# Patient Record
Sex: Male | Born: 1975 | Hispanic: Yes | Marital: Single | Smoking: Never smoker
Health system: Southern US, Community
[De-identification: ages and names within clinical notes are randomized; demographics above are authoritative.]

---

## 2019-02-11 ENCOUNTER — Inpatient Hospital Stay (HOSPITAL_COMMUNITY)
Admission: AD | Admit: 2019-02-11 | Discharge: 2019-02-16 | DRG: 177 | Disposition: A | Discharge status: Home / Self Care | Payer: HRSA Program | Source: Other Acute Inpatient Hospital | Attending: Internal Medicine | Admitting: Internal Medicine

## 2019-02-11 ENCOUNTER — Other Ambulatory Visit: Payer: Self-pay

## 2019-02-11 DIAGNOSIS — R7401 Elevation of levels of liver transaminase levels: Secondary | ICD-10-CM | POA: Diagnosis present

## 2019-02-11 DIAGNOSIS — R740 Nonspecific elevation of levels of transaminase and lactic acid dehydrogenase [LDH]: Secondary | ICD-10-CM | POA: Diagnosis present

## 2019-02-11 DIAGNOSIS — J1289 Other viral pneumonia: Secondary | ICD-10-CM | POA: Diagnosis present

## 2019-02-11 DIAGNOSIS — R0602 Shortness of breath: Secondary | ICD-10-CM

## 2019-02-11 DIAGNOSIS — Z9119 Patient's noncompliance with other medical treatment and regimen: Secondary | ICD-10-CM

## 2019-02-11 DIAGNOSIS — U071 COVID-19: Principal | ICD-10-CM

## 2019-02-11 DIAGNOSIS — E876 Hypokalemia: Secondary | ICD-10-CM | POA: Diagnosis present

## 2019-02-11 DIAGNOSIS — J9601 Acute respiratory failure with hypoxia: Secondary | ICD-10-CM

## 2019-02-11 DIAGNOSIS — E8770 Fluid overload, unspecified: Secondary | ICD-10-CM | POA: Diagnosis present

## 2019-02-11 DIAGNOSIS — J069 Acute upper respiratory infection, unspecified: Secondary | ICD-10-CM | POA: Diagnosis not present

## 2019-02-11 MED ORDER — ENOXAPARIN SODIUM 150 MG/ML ~~LOC~~ SOLN: 1.0000 mg/kg | Freq: Two times a day (BID) | SUBCUTANEOUS | Status: DC

## 2019-02-11 MED ORDER — LACTATED RINGERS IV SOLN
INTRAVENOUS | Status: DC
  Administered 2019-02-11 – 2019-02-14 (×4): via INTRAVENOUS

## 2019-02-11 MED ORDER — ENOXAPARIN SODIUM 80 MG/0.8ML ~~LOC~~ SOLN
1.0000 mg/kg | Freq: Two times a day (BID) | SUBCUTANEOUS | Status: DC
  Administered 2019-02-12: 75 mg via SUBCUTANEOUS
  Filled 2019-02-11: qty 0.8

## 2019-02-11 MED ORDER — SODIUM CHLORIDE 0.9 % IV SOLN: 1.0000 g | INTRAVENOUS | Status: DC

## 2019-02-11 MED ORDER — SODIUM CHLORIDE 0.9 % IV SOLN: 500.0000 mg | INTRAVENOUS | Status: DC

## 2019-02-11 MED ORDER — ENOXAPARIN SODIUM 30 MG/0.3ML ~~LOC~~ SOLN
30.0000 mg | Freq: Once | SUBCUTANEOUS | Status: AC
  Administered 2019-02-11: 23:00:00 30 mg via SUBCUTANEOUS
  Filled 2019-02-11: qty 0.3

## 2019-02-11 MED ORDER — POLYETHYLENE GLYCOL 3350 17 G PO PACK: 17.0000 g | PACK | Freq: Every day | ORAL | Status: DC | PRN

## 2019-02-11 MED ORDER — ONDANSETRON HCL 4 MG/2ML IJ SOLN: 4.0000 mg | Freq: Four times a day (QID) | INTRAMUSCULAR | Status: DC | PRN

## 2019-02-11 MED ORDER — METHYLPREDNISOLONE SODIUM SUCC 125 MG IJ SOLR
60.0000 mg | Freq: Four times a day (QID) | INTRAMUSCULAR | Status: DC
  Administered 2019-02-11 – 2019-02-12 (×3): 60 mg via INTRAVENOUS
  Filled 2019-02-11 (×3): qty 2

## 2019-02-11 MED ORDER — ONDANSETRON HCL 4 MG PO TABS: 4.0000 mg | ORAL_TABLET | Freq: Four times a day (QID) | ORAL | Status: DC | PRN

## 2019-02-11 MED ORDER — ACETAMINOPHEN 325 MG PO TABS
650.0000 mg | ORAL_TABLET | Freq: Four times a day (QID) | ORAL | Status: DC | PRN
  Administered 2019-02-12: 11:00:00 650 mg via ORAL
  Filled 2019-02-11: qty 2

## 2019-02-12 ENCOUNTER — Inpatient Hospital Stay (HOSPITAL_COMMUNITY): Payer: HRSA Program

## 2019-02-12 ENCOUNTER — Encounter (HOSPITAL_COMMUNITY): Payer: Self-pay

## 2019-02-12 LAB — COMPREHENSIVE METABOLIC PANEL
ALT: 84 U/L — ABNORMAL HIGH (ref 0–44)
AST: 211 U/L — ABNORMAL HIGH (ref 15–41)
Albumin: 2.6 g/dL — ABNORMAL LOW (ref 3.5–5.0)
Alkaline Phosphatase: 121 U/L (ref 38–126)
Anion gap: 12 (ref 5–15)
BUN: 15 mg/dL (ref 6–20)
CO2: 29 mmol/L (ref 22–32)
Calcium: 7.9 mg/dL — ABNORMAL LOW (ref 8.9–10.3)
Chloride: 94 mmol/L — ABNORMAL LOW (ref 98–111)
Creatinine, Ser: 0.73 mg/dL (ref 0.61–1.24)
GFR calc Af Amer: >60 mL/min (ref >60)
GFR calc non Af Amer: >60 mL/min (ref >60)
Glucose, Bld: 156 mg/dL — ABNORMAL HIGH (ref 70–99)
Potassium: 3.1 mmol/L — ABNORMAL LOW (ref 3.5–5.1)
Sodium: 135 mmol/L (ref 135–145)
Total Bilirubin: 0.8 mg/dL (ref 0.3–1.2)
Total Protein: 6.5 g/dL (ref 6.5–8.1)

## 2019-02-12 LAB — CBC WITH DIFFERENTIAL/PLATELET
Abs Immature Granulocytes: 0.04 10*3/uL (ref 0.00–0.07)
Basophils Absolute: 0 10*3/uL (ref 0.0–0.1)
Basophils Relative: 0 %
Eosinophils Absolute: 0 10*3/uL (ref 0.0–0.5)
Eosinophils Relative: 0 %
HCT: 44.9 % (ref 39.0–52.0)
Hemoglobin: 15.7 g/dL (ref 13.0–17.0)
Immature Granulocytes: 1 %
Lymphocytes Relative: 19 %
Lymphs Abs: 1.2 10*3/uL (ref 0.7–4.0)
MCH: 34.2 pg — ABNORMAL HIGH (ref 26.0–34.0)
MCHC: 35 g/dL (ref 30.0–36.0)
MCV: 97.8 fL (ref 80.0–100.0)
Monocytes Absolute: 0.3 10*3/uL (ref 0.1–1.0)
Monocytes Relative: 5 %
Neutro Abs: 4.5 10*3/uL (ref 1.7–7.7)
Neutrophils Relative %: 75 %
Platelets: 148 10*3/uL — ABNORMAL LOW (ref 150–400)
RBC: 4.59 MIL/uL (ref 4.22–5.81)
RDW: 13.6 % (ref 11.5–15.5)
WBC: 6 10*3/uL (ref 4.0–10.5)
nRBC: 0 % (ref 0.0–0.2)

## 2019-02-12 LAB — FERRITIN: Ferritin: 1031 ng/mL — ABNORMAL HIGH (ref 24–336)

## 2019-02-12 LAB — CK: Total CK: 93 U/L (ref 49–397)

## 2019-02-12 LAB — D-DIMER, QUANTITATIVE: D-Dimer, Quant: 0.38 ug/mL-FEU (ref 0.00–0.50)

## 2019-02-12 LAB — PHOSPHORUS: Phosphorus: 3.3 mg/dL (ref 2.5–4.6)

## 2019-02-12 LAB — PROCALCITONIN: Procalcitonin: 2.75 ng/mL

## 2019-02-12 LAB — ABO/RH: ABO/RH(D): A POS

## 2019-02-12 LAB — C-REACTIVE PROTEIN: CRP: 9.7 mg/dL — ABNORMAL HIGH (ref <1.0)

## 2019-02-12 LAB — TRIGLYCERIDES: Triglycerides: 173 mg/dL — ABNORMAL HIGH (ref <150)

## 2019-02-12 LAB — MAGNESIUM: Magnesium: 2.5 mg/dL — ABNORMAL HIGH (ref 1.7–2.4)

## 2019-02-12 MED ORDER — GUAIFENESIN-CODEINE 100-10 MG/5ML PO SOLN
10.0000 mL | Freq: Once | ORAL | Status: AC
  Administered 2019-02-12: 10 mL via ORAL
  Filled 2019-02-12: qty 10

## 2019-02-12 MED ORDER — TOCILIZUMAB 400 MG/20ML IV SOLN: 8.0000 mg/kg | Freq: Once | INTRAVENOUS | Status: DC

## 2019-02-12 MED ORDER — TOCILIZUMAB 400 MG/20ML IV SOLN
8.0000 mg/kg | Freq: Once | INTRAVENOUS | Status: AC
  Administered 2019-02-12: 14:00:00 598 mg via INTRAVENOUS
  Filled 2019-02-12: qty 20

## 2019-02-12 MED ORDER — METHYLPREDNISOLONE SODIUM SUCC 125 MG IJ SOLR
60.0000 mg | Freq: Two times a day (BID) | INTRAMUSCULAR | Status: DC
  Administered 2019-02-13 – 2019-02-16 (×8): 60 mg via INTRAVENOUS
  Filled 2019-02-12 (×8): qty 2

## 2019-02-12 MED ORDER — POTASSIUM CHLORIDE CRYS ER 20 MEQ PO TBCR
40.0000 meq | EXTENDED_RELEASE_TABLET | Freq: Four times a day (QID) | ORAL | Status: AC
  Administered 2019-02-12 (×2): 40 meq via ORAL
  Filled 2019-02-12 (×2): qty 2

## 2019-02-12 MED ORDER — POLYVINYL ALCOHOL 1.4 % OP SOLN
1.0000 [drp] | OPHTHALMIC | Status: DC | PRN
  Filled 2019-02-12 (×2): qty 15

## 2019-02-12 MED ORDER — ENOXAPARIN SODIUM 40 MG/0.4ML ~~LOC~~ SOLN
40.0000 mg | Freq: Two times a day (BID) | SUBCUTANEOUS | Status: DC
  Administered 2019-02-12 – 2019-02-16 (×8): 40 mg via SUBCUTANEOUS
  Filled 2019-02-12 (×8): qty 0.4

## 2019-02-12 NOTE — Progress Notes (Signed)
CardioV[facility] Research Department and AHF Team  ReDS Research Project   Patient #: [id number]  ReDS Measurement  Right: low quality x 3  Left:   

## 2019-02-12 NOTE — H&P (Signed)
History and Physical  [name] AVW:[id number] DOB: 03-Feb-1976 DOA: 02/11/2019  Referring physician: Corbin Ade, ER PA PCP: Patient does not have a PCP Outpatient Specialists: None Patient coming from: Home & is able to ambulate without assistance  Chief Complaint: Shortness of breath, cough and fever  HPI: Charles Harvey is a 43 y.o. male with no past medical history or previous hospitalizations who was in his usual state of health when approximately 1 week ago, started experiencing a multitude of symptoms including diarrhea, fever, nonproductive cough and shortness of breath.  With persistence of symptoms, he came into the emergency room at [facility] on the night of 5/14  ED Course: In the emergency room, patient had a chest x-ray which noted bilateral hazy opacities concerning for COVID-19.  Coronavirus test positive.  Patient had the rest of his blood work done which noted elevated d-dimer, LDH CRP and ferritin.  Noted to be hypoxemic on room air.  Patient placed on 2 L nasal cannula which improved his oxygenation to 93%.  Patient also noted to have transaminitis with AST of 374 and ALT of 117.  Hospitalist were called and patient was transported to [facility] for further care management  Review of Systems: Patient seen after arrival to [facility]. Pt complains of some mild pain located below midsternal most notably when he takes a deep inspiration.  Says his breathing is better now that he is on oxygen.  Still a very mild cough.  No abdominal pain, but he had some earlier when he had diarrhea.  Pt denies any headaches, vision changes, dysphagia, palpitations, current shortness of breath, wheezing, hematuria, dysuria, constipation, focal extremity numbness weakness or pain, nausea or vomiting.  Review of systems are otherwise negative  Past medical history: Patient denies any medical problems in the past.  Denies any previous surgeries or  hospitalizations.  Social History: Denies any tobacco, alcohol or drug use.  Lives at home with family.  Able to ambulate without assistance  Allergies: No known drug allergies.  Family history: Patient denies any medical problems that run in his family  Prior to Admission medications   Patient states he does not take any medications for anything    Physical Exam: BP 116/70   Pulse 71   Temp 98.4 F (36.9 C)   Resp 17   Ht 6' (1.829 m)   Wt 74.8 kg   SpO2 94%   BMI 22.38 kg/m   General: Alert and oriented x3, no acute distress Eyes: Sclera nonicteric, extraocular movements are intact ENT: Normocephalic and atraumatic, mucous membranes are moist Neck: Supple, no JVD Cardiovascular: Regular rate and rhythm, S1-S2 Respiratory: Limited inspiratory effort, but otherwise clear Abdomen: Soft, nontender, nondistended, positive bowel sounds Skin: Skin breaks, tears or lesions Musculoskeletal: No clubbing or cyanosis or edema Psychiatric: Appropriate, no evidence of psychoses Neurologic: No focal deficits          Labs on Admission:  Basic Metabolic Panel: No results for input(s): NA, K, CL, CO2, GLUCOSE, BUN, CREATININE, CALCIUM, MG, PHOS in the last 168 hours. Liver Function Tests: No results for input(s): AST, ALT, ALKPHOS, BILITOT, PROT, ALBUMIN in the last 168 hours. No results for input(s): LIPASE, AMYLASE in the last 168 hours. No results for input(s): AMMONIA in the last 168 hours. CBC: No results for input(s): WBC, NEUTROABS, HGB, HCT, MCV, PLT in the last 168 hours. Cardiac Enzymes: No results for input(s): CKTOTAL, CKMB, CKMBINDEX, TROPONINI in the last 168 hours.  BNP (last 3 results) No results for input(s): BNP in the last 8760 hours.  ProBNP (last 3 results) No results for input(s): PROBNP in the last 8760 hours.  CBG: No results for input(s): GLUCAP in the last 168 hours.  Radiological Exams on Admission: [facility].IMPRESSION: Diffuse hazy  bilateral airspace opacities concerning for an atypical infectious process such as viral pneumonia. Pulmonary edema can have a similar appearance in the appropriate clinical setting.  EKG: Not done  Assessment/Plan Present on Admission: . Acute respiratory failure with hypoxia (HCC) secondary to acute respiratory disease from COVID-19 virus: Symptomatically treat.  On IV steroids plus supplemental oxygen.  Given transaminitis, holding on Actemra.  Hopefully he will make a good recovery.  . Transaminitis: Unclear etiology.  No previous history of any liver disease.  Have ordered acute hepatitis panel and follow-up liver function test in the morning  Principal Problem:   Acute respiratory failure with hypoxia (HCC) Active Problems:   Acute respiratory disease due to COVID-19 virus   Transaminitis   DVT prophylaxis: Lovenox adjusted for COVID-19   Code Status: Full code  Family Communication: We will update family in the morning  Disposition Plan: Home once able to be weaned off of oxygen  Consults called: None  Admission status: Given need for acute hospital services past 2 midnights, admit as inpatient    Hollice EspySendil K Kerry Odonohue MD [facility] Pager [phone]  If 7PM-7AM, please contact night-coverage [email or web address] Password [email or web address]  02/12/2019, 1:24 AM

## 2019-02-12 NOTE — Progress Notes (Signed)
PROGRESS NOTE                                                                                                                                                                                                             Patient Demographics:    Charles Harvey, is a 43 y.o. male, DOB - 06/22/1976, XLK:[id number]  Outpatient Primary MD for the patient is No primary care provider on file.    LOS - 1  CC Fever     Brief Narrative   Charles Harvey is a 43 y.o. male with no past medical history or previous hospitalizations who was in his usual state of health when approximately 1 week ago, started experiencing a multitude of symptoms including diarrhea, fever, nonproductive cough and shortness of breath.  With persistence of symptoms, he came into [facility] on the night of 02/11/19.   Subjective:    Charles Harvey today has, No headache, No chest pain, No abdominal pain - No Nausea, No new weakness tingling or numbness, mild  Cough - SOB.     Assessment  & Plan :     1. Acute Hypoxic Resp. Failure due to Acute Covid 19 Viral Illness during the ongoing 2020 Covid 19 Pandemic - with documented hypoxia at [facility], chest x-ray showing viral pneumonitis, elevated inflammatory markers, started on IV steroids along with Actemra, trend clinically.  Encouraged to sit up in chair in the daytime, flutter valve added for pulmonary toiletry, when in bed requested to prone.  COVID-19 Labs  Recent Labs    02/12/19 0620  DDIMER 0.38  FERRITIN 1,031*  CRP 9.7*    No results found for: SARSCOV2NAA   2.  Mild transaminitis- due to COVID-19 infection, may go up slightly after Actemra use.  Will trend and monitor.  No right upper quadrant pain.  3.  Hypokalemia.  Replaced.      Condition -   Guarded  Family Communication  :  None  Code Status :  Full  Diet : Regular  Disposition  Plan  :  Home 3-4 days  Consults  :  None  Procedures  :  None  PUD Prophylaxis : None  DVT Prophylaxis  :  Lovenox   Lab Results  Component Value Date   PLT 148 (L) 02/12/2019  Inpatient Medications  Scheduled Meds: . enoxaparin (LOVENOX) injection  1 mg/kg Subcutaneous Q12H  . methylPREDNISolone (SOLU-MEDROL) injection  60 mg Intravenous Q6H   Continuous Infusions: . lactated ringers 100 mL/hr at 02/12/19 0800  . tocilizumab (ACTEMRA) IV     PRN Meds:.acetaminophen, [DISCONTINUED] ondansetron **OR** ondansetron (ZOFRAN) IV, polyethylene glycol  Antibiotics  :    Anti-infectives (From admission, onward)   Start     Dose/Rate Route Frequency Ordered Stop   02/12/19 1800  azithromycin (ZITHROMAX) 500 mg in sodium chloride 0.9 % 250 mL IVPB  Status:  Discontinued     500 mg 250 mL/hr over 60 Minutes Intravenous Every 24 hours 02/11/19 2150 02/12/19 0748   02/12/19 1700  cefTRIAXone (ROCEPHIN) 1 g in sodium chloride 0.9 % 100 mL IVPB  Status:  Discontinued     1 g 200 mL/hr over 30 Minutes Intravenous Every 24 hours 02/11/19 2150 02/12/19 0748       Time Spent in minutes  30   Susa Raring M.D on 02/12/2019 at 11:00 AM  To page go to [email or web address] - password [email or web address]  [facility] -  Office  [phone]     Admit date - 02/11/2019    1    Objective:   Vitals:   02/12/19 0400 02/12/19 0500 02/12/19 0600 02/12/19 0900  BP:      Pulse: (!) 55  (!) 55   Resp: (!) 31  (!) 26   Temp:  98.7 F (37.1 C)  98.6 F (37 C)  TempSrc:      SpO2: 94%  96%   Weight:      Height:        Wt Readings from Last 3 Encounters:  02/11/19 74.8 kg     Intake/Output Summary (Last 24 hours) at 02/12/2019 1100 Last data filed at 02/12/2019 0800 Gross per 24 hour  Intake 899.43 ml  Output -  Net 899.43 ml     Physical Exam  Awake Alert, Oriented X 3, No new F.N deficits, Normal affect [place].AT,PERRAL Supple Neck,No JVD, No cervical lymphadenopathy appriciated.   Symmetrical Chest wall movement, Good air movement bilaterally, CTAB RRR,No Gallops,Rubs or new Murmurs, No Parasternal Heave +ve B.Sounds, Abd Soft, No tenderness, No organomegaly appriciated, No rebound - guarding or rigidity. No Cyanosis, Clubbing or edema, No new Rash or bruise       Data Review:    CBC Recent Labs  Lab 02/12/19 0620  WBC 6.0  HGB 15.7  HCT 44.9  PLT 148*  MCV 97.8  MCH 34.2*  MCHC 35.0  RDW 13.6  LYMPHSABS 1.2  MONOABS 0.3  EOSABS 0.0  BASOSABS 0.0    Chemistries  Recent Labs  Lab 02/12/19 0620  NA 135  K 3.1*  CL 94*  CO2 29  GLUCOSE 156*  BUN 15  CREATININE 0.73  CALCIUM 7.9*  MG 2.5*  AST 211*  ALT 84*  ALKPHOS 121  BILITOT 0.8   ------------------------------------------------------------------------------------------------------------------ Recent Labs    02/12/19 0620  TRIG 173*    No results found for: HGBA1C ------------------------------------------------------------------------------------------------------------------ No results for input(s): TSH, T4TOTAL, T3FREE, THYROIDAB in the last 72 hours.  Invalid input(s): FREET3  Cardiac Enzymes No results for input(s): CKMB, TROPONINI, MYOGLOBIN in the last 168 hours.  Invalid input(s): CK ------------------------------------------------------------------------------------------------------------------ No results found for: BNP  Micro Results No results found for this or any previous visit (from the past 240 hour(s)).  Radiology Reports Dg Chest Port 1 View  Result Date: 02/12/2019 CLINICAL DATA:  Shortness of breath. Positive test for COVID-19. EXAM: PORTABLE CHEST 1 VIEW COMPARISON:  02/11/2019 FINDINGS: The cardiac silhouette is upper limits of normal in size. The lungs are mildly hypoinflated with diffuse bilateral interstitial type opacities as well as asymmetric opacity in the left mid lung, overall similar to the prior study. No pleural effusion or pneumothorax  is identified. No acute osseous abnormality is seen. IMPRESSION: Unchanged ill-defined lung opacities compatible with known viral infection. Electronically Signed   By: Sebastian Ache M.D.   On: 02/12/2019 08:55

## 2019-02-12 NOTE — Progress Notes (Signed)
Patient refusing to get in prone position  as ordered by provider. RN explained reason  and importance via interpreter services, patient states he cannot and won't sleep in that position. On call MD Dr Joseph Art made aware. Patient sats currently at 94-96% on 3L. I will continue to monitor patient has shift progresses

## 2019-02-12 NOTE — Progress Notes (Signed)
Patient asking for cough suppressant. Covering Hospitalist   paged via amion and informed of request

## 2019-02-12 NOTE — TOC Initial Note (Signed)
Transition of Care [facility]) - Initial/Assessment Note    Patient Details  Name: Charles Harvey MRN: [id number] Date of Birth: [birth year]  [facility] CM/SW Contact:    Colleen Can RN, BSN, NCM-BC, ACM-RN [phone] Phone Number: 02/12/2019, 2:32 PM  Clinical Narrative:                 43 yo male presented after experiencing diarrhea, fever, nonproductive cough and shortness of breath; COVID-19 (+). Patient lives at home with no DME in use PTA. Patient has no active health insurance, nor an established PCP. CM arranged a televisit hospital f/u appointment with: [facility] on 02/18/19 @ 1350; AVS updated. CM team will continue to follow for dispositional needs.   Expected Discharge Plan: Home/Self Care Barriers to Discharge: Continued Medical Work up   Expected Discharge Plan and Services Expected Discharge Plan: Home/Self Care   Discharge Planning Services: CM Consult, Follow-up appt scheduled, [facility]  Prior Living Arrangements/Services     Patient language and need for interpreter reviewed:: Yes  Activities of Daily Living Home Assistive Devices/Equipment: None ADL Screening (condition at time of admission) Patient's cognitive ability adequate to safely complete daily activities?: Yes Is the patient deaf or have difficulty hearing?: No Does the patient have difficulty seeing, even when wearing glasses/contacts?: No Does the patient have difficulty concentrating, remembering, or making decisions?: No Patient able to express need for assistance with ADLs?: Yes Does the patient have difficulty dressing or bathing?: No Independently performs ADLs?: Yes (appropriate for developmental age) Does the patient have difficulty walking or climbing stairs?: No Weakness of Legs: None Weakness of Arms/Hands: None  Admission diagnosis:  Covid positive Patient Active Problem List   Diagnosis Date Noted  . Acute respiratory failure with hypoxia (HCC) 02/11/2019   . Acute respiratory disease due to COVID-19 virus 02/11/2019  . Transaminitis 02/11/2019   PCP:  System, Pcp Not In Pharmacy:  No Pharmacies Listed    Social Determinants of Health (SDOH) Interventions    Readmission Risk Interventions No flowsheet data found.

## 2019-02-13 LAB — CBC WITH DIFFERENTIAL/PLATELET
Abs Immature Granulocytes: 0.21 10*3/uL — ABNORMAL HIGH (ref 0.00–0.07)
Basophils Absolute: 0 10*3/uL (ref 0.0–0.1)
Basophils Relative: 0 %
Eosinophils Absolute: 0 10*3/uL (ref 0.0–0.5)
Eosinophils Relative: 0 %
HCT: 42.8 % (ref 39.0–52.0)
Hemoglobin: 14.3 g/dL (ref 13.0–17.0)
Immature Granulocytes: 2 %
Lymphocytes Relative: 8 %
Lymphs Abs: 1.1 10*3/uL (ref 0.7–4.0)
MCH: 32.9 pg (ref 26.0–34.0)
MCHC: 33.4 g/dL (ref 30.0–36.0)
MCV: 98.6 fL (ref 80.0–100.0)
Monocytes Absolute: 0.7 10*3/uL (ref 0.1–1.0)
Monocytes Relative: 5 %
Neutro Abs: 10.6 10*3/uL — ABNORMAL HIGH (ref 1.7–7.7)
Neutrophils Relative %: 85 %
Platelets: 178 10*3/uL (ref 150–400)
RBC: 4.34 MIL/uL (ref 4.22–5.81)
RDW: 13.6 % (ref 11.5–15.5)
WBC: 12.6 10*3/uL — ABNORMAL HIGH (ref 4.0–10.5)
nRBC: 0 % (ref 0.0–0.2)

## 2019-02-13 LAB — COMPREHENSIVE METABOLIC PANEL
ALT: 69 U/L — ABNORMAL HIGH (ref 0–44)
AST: 119 U/L — ABNORMAL HIGH (ref 15–41)
Albumin: 2.6 g/dL — ABNORMAL LOW (ref 3.5–5.0)
Alkaline Phosphatase: 126 U/L (ref 38–126)
Anion gap: 9 (ref 5–15)
BUN: 17 mg/dL (ref 6–20)
CO2: 27 mmol/L (ref 22–32)
Calcium: 8 mg/dL — ABNORMAL LOW (ref 8.9–10.3)
Chloride: 101 mmol/L (ref 98–111)
Creatinine, Ser: 0.65 mg/dL (ref 0.61–1.24)
GFR calc Af Amer: >60 mL/min (ref >60)
GFR calc non Af Amer: >60 mL/min (ref >60)
Glucose, Bld: 140 mg/dL — ABNORMAL HIGH (ref 70–99)
Potassium: 3.5 mmol/L (ref 3.5–5.1)
Sodium: 137 mmol/L (ref 135–145)
Total Bilirubin: 0.6 mg/dL (ref 0.3–1.2)
Total Protein: 6.2 g/dL — ABNORMAL LOW (ref 6.5–8.1)

## 2019-02-13 LAB — C-REACTIVE PROTEIN: CRP: 4.4 mg/dL — ABNORMAL HIGH (ref <1.0)

## 2019-02-13 LAB — MAGNESIUM: Magnesium: 2.4 mg/dL (ref 1.7–2.4)

## 2019-02-13 LAB — HIV ANTIBODY (ROUTINE TESTING W REFLEX): HIV Screen 4th Generation wRfx: NONREACTIVE

## 2019-02-13 LAB — PHOSPHORUS: Phosphorus: 2.4 mg/dL — ABNORMAL LOW (ref 2.5–4.6)

## 2019-02-13 LAB — HEPATITIS PANEL, ACUTE
HCV Ab: <0.1 s/co ratio (ref 0.0–0.9)
Hep A IgM: NEGATIVE
Hep B C IgM: NEGATIVE
Hepatitis B Surface Ag: NEGATIVE

## 2019-02-13 LAB — FERRITIN: Ferritin: 716 ng/mL — ABNORMAL HIGH (ref 24–336)

## 2019-02-13 LAB — TRIGLYCERIDES: Triglycerides: 233 mg/dL — ABNORMAL HIGH (ref <150)

## 2019-02-13 LAB — INTERLEUKIN-6, PLASMA: Interleukin-6, Plasma: 7.5 pg/mL (ref 0.0–12.2)

## 2019-02-13 LAB — CK: Total CK: 60 U/L (ref 49–397)

## 2019-02-13 MED ORDER — GUAIFENESIN-DM 100-10 MG/5ML PO SYRP
10.0000 mL | ORAL_SOLUTION | ORAL | Status: DC | PRN
  Administered 2019-02-13 (×2): 10 mL via ORAL
  Filled 2019-02-13 (×2): qty 10

## 2019-02-13 NOTE — Progress Notes (Signed)
PROGRESS NOTE                                                                                                                                                                                                             Patient Demographics:    Charles Harvey, is a 43 y.o. male, DOB - Nov 01, 1975, YDS:[id number]  Outpatient Primary MD for the patient is System, Pcp Not In    LOS - 2  CC Fever     Brief Narrative   Charles Harvey is a 43 y.o. male with no past medical history or previous hospitalizations who was in his usual state of health when approximately 1 week ago, started experiencing a multitude of symptoms including diarrhea, fever, nonproductive cough and shortness of breath.  With persistence of symptoms, he came into the emergency room at [facility] on the night of 02/11/19.   Subjective:   Patient in bed, appears comfortable, denies any headache, no fever, no chest pain or pressure, improved shortness of breath has mild cough , no abdominal pain. No focal weakness.    Assessment  & Plan :     1. Acute Hypoxic Resp. Failure due to Acute Covid 19 Viral Illness during the ongoing 2020 Covid 19 Pandemic - with documented hypoxia at [facility], chest x-ray showing viral pneumonitis, elevated inflammatory markers, he was started on IV steroids along with Actemra on 02/13/2019, inflammatory markers have started to trend down, shortness of breath has improved. Encouraged to sit up in chair in the daytime, flutter valve added for pulmonary toiletry, when in bed requested to prone.  Will monitor clinically if continues to improve discharge in 1 to 2 days on oral steroid taper.  COVID-19 Labs  Recent Labs    02/12/19 0620 02/13/19 0333  DDIMER 0.38  --   FERRITIN 1,031* 716*  CRP 9.7* 4.4*    No results found for: SARSCOV2NAA   2.  Mild transaminitis- due to COVID-19 infection, may go up slightly  after Actemra use.  Improving trend.  No right upper quadrant pain.  3.  Hypokalemia.  Replaced.      Condition -   Fair  Family Communication  :  None  Code Status :  Full  Diet : Regular  Disposition Plan  :  Home 1-2 days  Consults  :  None  Procedures  :  None  PUD Prophylaxis : None  DVT Prophylaxis  :  Lovenox   Lab Results  Component Value Date   PLT 178 02/13/2019    Inpatient Medications  Scheduled Meds: . enoxaparin (LOVENOX) injection  40 mg Subcutaneous Q12H  . methylPREDNISolone (SOLU-MEDROL) injection  60 mg Intravenous Q12H   Continuous Infusions: . lactated ringers 100 mL/hr at 02/12/19 1600   PRN Meds:.acetaminophen, [DISCONTINUED] ondansetron **OR** ondansetron (ZOFRAN) IV, polyethylene glycol, polyvinyl alcohol  Antibiotics  :    Anti-infectives (From admission, onward)   Start     Dose/Rate Route Frequency Ordered Stop   02/12/19 1800  azithromycin (ZITHROMAX) 500 mg in sodium chloride 0.9 % 250 mL IVPB  Status:  Discontinued     500 mg 250 mL/hr over 60 Minutes Intravenous Every 24 hours 02/11/19 2150 02/12/19 0748   02/12/19 1700  cefTRIAXone (ROCEPHIN) 1 g in sodium chloride 0.9 % 100 mL IVPB  Status:  Discontinued     1 g 200 mL/hr over 30 Minutes Intravenous Every 24 hours 02/11/19 2150 02/12/19 0748       Time Spent in minutes  30   Susa RaringPrashant Khoen Genet M.D on 02/13/2019 at 10:58 AM  To page go to [email or web address] - password [email or web address]  [facility] -  Office  [phone]     Admit date - 02/11/2019    2    Objective:   Vitals:   02/12/19 1300 02/12/19 2300 02/13/19 0439 02/13/19 1014  BP:  104/60 106/71 117/73  Pulse:  75 64 64  Resp:      Temp: 98.7 F (37.1 C) 98.7 F (37.1 C) 98.3 F (36.8 C)   TempSrc:  Oral Oral   SpO2:  91% 95% 93%  Weight:      Height:        Wt Readings from Last 3 Encounters:  02/11/19 74.8 kg     Intake/Output Summary (Last 24 hours) at 02/13/2019 1058 Last data filed at 02/13/2019  1000 Gross per 24 hour  Intake 2945.93 ml  Output -  Net 2945.93 ml     Physical Exam  Awake Alert, Oriented X 3, No new F.N deficits, Normal affect [place].AT,PERRAL Supple Neck,No JVD, No cervical lymphadenopathy appriciated.  Symmetrical Chest wall movement, Good air movement bilaterally, CTAB RRR,No Gallops, Rubs or new Murmurs, No Parasternal Heave +ve B.Sounds, Abd Soft, No tenderness, No organomegaly appriciated, No rebound - guarding or rigidity. No Cyanosis, Clubbing or edema, No new Rash or bruise    Data Review:    CBC Recent Labs  Lab 02/12/19 0620 02/13/19 0333  WBC 6.0 12.6*  HGB 15.7 14.3  HCT 44.9 42.8  PLT 148* 178  MCV 97.8 98.6  MCH 34.2* 32.9  MCHC 35.0 33.4  RDW 13.6 13.6  LYMPHSABS 1.2 1.1  MONOABS 0.3 0.7  EOSABS 0.0 0.0  BASOSABS 0.0 0.0    Chemistries  Recent Labs  Lab 02/12/19 0620 02/13/19 0333  NA 135 137  K 3.1* 3.5  CL 94* 101  CO2 29 27  GLUCOSE 156* 140*  BUN 15 17  CREATININE 0.73 0.65  CALCIUM 7.9* 8.0*  MG 2.5* 2.4  AST 211* 119*  ALT 84* 69*  ALKPHOS 121 126  BILITOT 0.8 0.6   ------------------------------------------------------------------------------------------------------------------ Recent Labs    02/12/19 0620 02/13/19 0333  TRIG 173* 233*    No results found for: HGBA1C ------------------------------------------------------------------------------------------------------------------ No results for input(s): TSH, T4TOTAL, T3FREE, THYROIDAB in the last 72 hours.  Invalid  input(s): FREET3  Cardiac Enzymes No results for input(s): CKMB, TROPONINI, MYOGLOBIN in the last 168 hours.  Invalid input(s): CK ------------------------------------------------------------------------------------------------------------------ No results found for: BNP  Micro Results No results found for this or any previous visit (from the past 240 hour(s)).  Radiology Reports Dg Chest Port 1 View  Result Date: 02/12/2019  CLINICAL DATA:  Shortness of breath. Positive test for COVID-19. EXAM: PORTABLE CHEST 1 VIEW COMPARISON:  02/11/2019 FINDINGS: The cardiac silhouette is upper limits of normal in size. The lungs are mildly hypoinflated with diffuse bilateral interstitial type opacities as well as asymmetric opacity in the left mid lung, overall similar to the prior study. No pleural effusion or pneumothorax is identified. No acute osseous abnormality is seen. IMPRESSION: Unchanged ill-defined lung opacities compatible with known viral infection. Electronically Signed   By: Sebastian Ache M.D.   On: 02/12/2019 08:55

## 2019-02-13 NOTE — Progress Notes (Signed)
Ask patient if we wished for me to call his wife, he admits being in contact with his wife about his wellbeing via personal cell

## 2019-02-13 NOTE — Progress Notes (Signed)
CardioV[facility] Research Department and AHF Team  ReDS Research Project   Patient #: [id number]  ReDS Measurement  Right: low quality x 3  Left:   

## 2019-02-13 NOTE — Progress Notes (Signed)
Via interpreter services patient stated he did not wish to have his wife called, as he has been talking with  Her  directly about his status

## 2019-02-13 NOTE — Progress Notes (Signed)
Cough syrup( Guaifenesin-codeine) given at this time to help with consistent cough

## 2019-02-14 ENCOUNTER — Inpatient Hospital Stay (HOSPITAL_COMMUNITY): Payer: HRSA Program

## 2019-02-14 LAB — COMPREHENSIVE METABOLIC PANEL
ALT: 84 U/L — ABNORMAL HIGH (ref 0–44)
AST: 163 U/L — ABNORMAL HIGH (ref 15–41)
Albumin: 2.6 g/dL — ABNORMAL LOW (ref 3.5–5.0)
Alkaline Phosphatase: 137 U/L — ABNORMAL HIGH (ref 38–126)
Anion gap: 14 (ref 5–15)
BUN: 16 mg/dL (ref 6–20)
CO2: 23 mmol/L (ref 22–32)
Calcium: 8.3 mg/dL — ABNORMAL LOW (ref 8.9–10.3)
Chloride: 104 mmol/L (ref 98–111)
Creatinine, Ser: 0.59 mg/dL — ABNORMAL LOW (ref 0.61–1.24)
GFR calc Af Amer: >60 mL/min (ref >60)
GFR calc non Af Amer: >60 mL/min (ref >60)
Glucose, Bld: 119 mg/dL — ABNORMAL HIGH (ref 70–99)
Potassium: 3.8 mmol/L (ref 3.5–5.1)
Sodium: 141 mmol/L (ref 135–145)
Total Bilirubin: 0.7 mg/dL (ref 0.3–1.2)
Total Protein: 6.1 g/dL — ABNORMAL LOW (ref 6.5–8.1)

## 2019-02-14 LAB — CBC WITH DIFFERENTIAL/PLATELET
Abs Immature Granulocytes: 0.16 10*3/uL — ABNORMAL HIGH (ref 0.00–0.07)
Basophils Absolute: 0 10*3/uL (ref 0.0–0.1)
Basophils Relative: 0 %
Eosinophils Absolute: 0 10*3/uL (ref 0.0–0.5)
Eosinophils Relative: 0 %
HCT: 43.2 % (ref 39.0–52.0)
Hemoglobin: 14.5 g/dL (ref 13.0–17.0)
Immature Granulocytes: 1 %
Lymphocytes Relative: 7 %
Lymphs Abs: 1 10*3/uL (ref 0.7–4.0)
MCH: 33.6 pg (ref 26.0–34.0)
MCHC: 33.6 g/dL (ref 30.0–36.0)
MCV: 100.2 fL — ABNORMAL HIGH (ref 80.0–100.0)
Monocytes Absolute: 0.7 10*3/uL (ref 0.1–1.0)
Monocytes Relative: 5 %
Neutro Abs: 11.4 10*3/uL — ABNORMAL HIGH (ref 1.7–7.7)
Neutrophils Relative %: 87 %
Platelets: 200 10*3/uL (ref 150–400)
RBC: 4.31 MIL/uL (ref 4.22–5.81)
RDW: 13.8 % (ref 11.5–15.5)
WBC: 13.2 10*3/uL — ABNORMAL HIGH (ref 4.0–10.5)
nRBC: 0 % (ref 0.0–0.2)

## 2019-02-14 LAB — MAGNESIUM: Magnesium: 2.5 mg/dL — ABNORMAL HIGH (ref 1.7–2.4)

## 2019-02-14 LAB — PHOSPHORUS: Phosphorus: 3.4 mg/dL (ref 2.5–4.6)

## 2019-02-14 LAB — INTERLEUKIN-6, PLASMA: Interleukin-6, Plasma: 127.8 pg/mL — ABNORMAL HIGH (ref 0.0–12.2)

## 2019-02-14 LAB — FERRITIN: Ferritin: 737 ng/mL — ABNORMAL HIGH (ref 24–336)

## 2019-02-14 LAB — C-REACTIVE PROTEIN: CRP: 1.9 mg/dL — ABNORMAL HIGH (ref <1.0)

## 2019-02-14 MED ORDER — FUROSEMIDE 10 MG/ML IJ SOLN
20.0000 mg | Freq: Once | INTRAMUSCULAR | Status: AC
  Administered 2019-02-14: 11:00:00 20 mg via INTRAVENOUS
  Filled 2019-02-14: qty 2

## 2019-02-14 NOTE — Progress Notes (Signed)
PROGRESS NOTE                                                                                                                                                                                                             Patient Demographics:    Charles Harvey, is a 43 y.o. male, DOB - 09/04/1976, ZOX:[id number]  Outpatient Primary MD for the patient is System, Pcp Not In    LOS - 3  CC Fever     Brief Narrative   Charles Harvey is a 43 y.o. male with no past medical history or previous hospitalizations who was in his usual state of health when approximately 1 week ago, started experiencing a multitude of symptoms including diarrhea, fever, nonproductive cough and shortness of breath.  With persistence of symptoms, he [facility] on the night of 02/11/19.   Subjective:   Patient in bed, appears comfortable, denies any headache, no fever, no chest pain or pressure, +ve excertional shortness of breath + cough, no abdominal pain. No focal weakness.    Assessment  & Plan :     1. Acute Hypoxic Resp. Failure due to Acute Covid 19 Viral Illness during the ongoing 2020 Covid 19 Pandemic - with [facility] hypoxia [facility], chest x-ray showing viral pneumonitis, elevated inflammatory markers, he was started on IV steroids along with Actemra on 02/13/2019, inflammatory markers have started to trend down, shortness of breath has improved. Encouraged to sit up in chair in the daytime, flutter valve added for pulmonary toiletry, when in bed requested to prone he has been noncompliant with the suggestions and was counseled strictly in the presence of nursing staff on 02/14/2019.  For now continue steroids.  Continue trending inflammatory markers, gradual but definite improvement.  Will repeat chest x-ray tomorrow along with inflammatory markers, may get another dose of Actemra if clinically  warranted.  COVID-19 Labs  Recent Labs    02/12/19 0620 02/13/19 0333 02/14/19 0126  DDIMER 0.38  --   --   FERRITIN 1,031* 716* 737*  CRP 9.7* 4.4* 1.9*    No results found for: SARSCOV2NAA   2.  Mild transaminitis- due to COVID-19 infection, may go up slightly after Actemra use.  Improving trend.  No right upper quadrant pain.  3.  Hypokalemia.  Replaced.  Condition -   Fair  Family Communication  :  None  Code Status :  Full  Diet : Regular  Disposition Plan  :  Home 1-2 days  Consults  :  None  Procedures  :  None  PUD Prophylaxis : None  DVT Prophylaxis  :  Lovenox   Lab Results  Component Value Date   PLT 200 02/14/2019    Inpatient Medications  Scheduled Meds: . enoxaparin (LOVENOX) injection  40 mg Subcutaneous Q12H  . methylPREDNISolone (SOLU-MEDROL) injection  60 mg Intravenous Q12H   Continuous Infusions: . lactated ringers Stopped (02/14/19 0852)   PRN Meds:.acetaminophen, guaiFENesin-dextromethorphan, [DISCONTINUED] ondansetron **OR** ondansetron (ZOFRAN) IV, polyethylene glycol, polyvinyl alcohol  Antibiotics  :    Anti-infectives (From admission, onward)   Start     Dose/Rate Route Frequency Ordered Stop   02/12/19 1800  azithromycin (ZITHROMAX) 500 mg in sodium chloride 0.9 % 250 mL IVPB  Status:  Discontinued     500 mg 250 mL/hr over 60 Minutes Intravenous Every 24 hours 02/11/19 2150 02/12/19 0748   02/12/19 1700  cefTRIAXone (ROCEPHIN) 1 g in sodium chloride 0.9 % 100 mL IVPB  Status:  Discontinued     1 g 200 mL/hr over 30 Minutes Intravenous Every 24 hours 02/11/19 2150 02/12/19 0748       Time Spent in minutes  30   Susa Raring M.D on 02/14/2019 at 9:34 AM  To page go to [email or web address] - password [email or web address]  [facility] -  Office  [phone]     Admit date - 02/11/2019    3    Objective:   Vitals:   02/13/19 1452 02/13/19 2106 02/14/19 0446 02/14/19 0807  BP: 111/74 116/74 114/78 120/72  Pulse: 62      Resp:    20  Temp: 98.6 F (37 C)  98.6 F (37 C) 98.5 F (36.9 C)  TempSrc: Oral   Oral  SpO2: 95%   92%  Weight:      Height:        Wt Readings from Last 3 Encounters:  02/11/19 74.8 kg     Intake/Output Summary (Last 24 hours) at 02/14/2019 0934 Last data filed at 02/14/2019 0852 Gross per 24 hour  Intake 3142.07 ml  Output 600 ml  Net 2542.07 ml     Physical Exam  Awake Alert, Oriented X 3, No new F.N deficits, Normal affect [place].AT,PERRAL Supple Neck,No JVD, No cervical lymphadenopathy appriciated.  Symmetrical Chest wall movement, Good air movement bilaterally, CTAB RRR,No Gallops, Rubs or new Murmurs, No Parasternal Heave +ve B.Sounds, Abd Soft, No tenderness, No organomegaly appriciated, No rebound - guarding or rigidity. No Cyanosis, Clubbing or edema, No new Rash or bruise    Data Review:    CBC Recent Labs  Lab 02/12/19 0620 02/13/19 0333 02/14/19 0126  WBC 6.0 12.6* 13.2*  HGB 15.7 14.3 14.5  HCT 44.9 42.8 43.2  PLT 148* 178 200  MCV 97.8 98.6 100.2*  MCH 34.2* 32.9 33.6  MCHC 35.0 33.4 33.6  RDW 13.6 13.6 13.8  LYMPHSABS 1.2 1.1 1.0  MONOABS 0.3 0.7 0.7  EOSABS 0.0 0.0 0.0  BASOSABS 0.0 0.0 0.0    Chemistries  Recent Labs  Lab 02/12/19 0620 02/13/19 0333 02/14/19 0126  NA 135 137 141  K 3.1* 3.5 3.8  CL 94* 101 104  CO2 GLUCOSE 156* 140* 119*  BUN CREATININE 0.73 0.65 0.59*  CALCIUM 7.9* 8.0* 8.3*  MG 2.5* 2.4 2.5*  AST 211* 119* 163*  ALT 84* 69* 84*  ALKPHOS 121 126 137*  BILITOT 0.8 0.6 0.7   ------------------------------------------------------------------------------------------------------------------ Recent Labs    02/12/19 0620 02/13/19 0333  TRIG 173* 233*    No results found for: HGBA1C ------------------------------------------------------------------------------------------------------------------ No results for input(s): TSH, T4TOTAL, T3FREE, THYROIDAB in the last 72 hours.   Invalid input(s): FREET3  Cardiac Enzymes No results for input(s): CKMB, TROPONINI, MYOGLOBIN in the last 168 hours.  Invalid input(s): CK ------------------------------------------------------------------------------------------------------------------ No results found for: BNP  Micro Results No results found for this or any previous visit (from the past 240 hour(s)).  Radiology Reports Dg Chest Port 1 View  Result Date: 02/12/2019 CLINICAL DATA:  Shortness of breath. Positive test for COVID-19. EXAM: PORTABLE CHEST 1 VIEW COMPARISON:  02/11/2019 FINDINGS: The cardiac silhouette is upper limits of normal in size. The lungs are mildly hypoinflated with diffuse bilateral interstitial type opacities as well as asymmetric opacity in the left mid lung, overall similar to the prior study. No pleural effusion or pneumothorax is identified. No acute osseous abnormality is seen. IMPRESSION: Unchanged ill-defined lung opacities compatible with known viral infection. Electronically Signed   By: Sebastian Ache M.D.   On: 02/12/2019 08:55

## 2019-02-14 NOTE — Plan of Care (Signed)
Discussed with patient plan of care for the evening, pain management and they informed contact person his contact Betty (didn't want me to update at this this time) with some teach back displayed. 

## 2019-02-14 NOTE — Progress Notes (Signed)
CardioVascular Research Department and AHF Team  ReDS Research Project   Patient #: [id number]  ReDS Measurement  Right: 48 %  Left: 52 %

## 2019-02-15 LAB — CBC WITH DIFFERENTIAL/PLATELET
Abs Immature Granulocytes: 0.13 10*3/uL — ABNORMAL HIGH (ref 0.00–0.07)
Basophils Absolute: 0 10*3/uL (ref 0.0–0.1)
Basophils Relative: 0 %
Eosinophils Absolute: 0 10*3/uL (ref 0.0–0.5)
Eosinophils Relative: 0 %
HCT: 44.5 % (ref 39.0–52.0)
Hemoglobin: 15 g/dL (ref 13.0–17.0)
Immature Granulocytes: 1 %
Lymphocytes Relative: 9 %
Lymphs Abs: 0.9 10*3/uL (ref 0.7–4.0)
MCH: 33.3 pg (ref 26.0–34.0)
MCHC: 33.7 g/dL (ref 30.0–36.0)
MCV: 98.7 fL (ref 80.0–100.0)
Monocytes Absolute: 0.6 10*3/uL (ref 0.1–1.0)
Monocytes Relative: 6 %
Neutro Abs: 8.5 10*3/uL — ABNORMAL HIGH (ref 1.7–7.7)
Neutrophils Relative %: 84 %
Platelets: 213 10*3/uL (ref 150–400)
RBC: 4.51 MIL/uL (ref 4.22–5.81)
RDW: 13.5 % (ref 11.5–15.5)
WBC: 10.2 10*3/uL (ref 4.0–10.5)
nRBC: 0 % (ref 0.0–0.2)

## 2019-02-15 LAB — COMPREHENSIVE METABOLIC PANEL
ALT: 132 U/L — ABNORMAL HIGH (ref 0–44)
AST: 205 U/L — ABNORMAL HIGH (ref 15–41)
Albumin: 2.9 g/dL — ABNORMAL LOW (ref 3.5–5.0)
Alkaline Phosphatase: 143 U/L — ABNORMAL HIGH (ref 38–126)
Anion gap: 7 (ref 5–15)
BUN: 16 mg/dL (ref 6–20)
CO2: 27 mmol/L (ref 22–32)
Calcium: 8.2 mg/dL — ABNORMAL LOW (ref 8.9–10.3)
Chloride: 103 mmol/L (ref 98–111)
Creatinine, Ser: 0.63 mg/dL (ref 0.61–1.24)
GFR calc Af Amer: >60 mL/min (ref >60)
GFR calc non Af Amer: >60 mL/min (ref >60)
Glucose, Bld: 128 mg/dL — ABNORMAL HIGH (ref 70–99)
Potassium: 3.8 mmol/L (ref 3.5–5.1)
Sodium: 137 mmol/L (ref 135–145)
Total Bilirubin: 0.9 mg/dL (ref 0.3–1.2)
Total Protein: 6.6 g/dL (ref 6.5–8.1)

## 2019-02-15 LAB — FERRITIN: Ferritin: 754 ng/mL — ABNORMAL HIGH (ref 24–336)

## 2019-02-15 LAB — D-DIMER, QUANTITATIVE: D-Dimer, Quant: 0.66 ug/mL-FEU — ABNORMAL HIGH (ref 0.00–0.50)

## 2019-02-15 LAB — MAGNESIUM: Magnesium: 2.7 mg/dL — ABNORMAL HIGH (ref 1.7–2.4)

## 2019-02-15 LAB — C-REACTIVE PROTEIN: CRP: 1 mg/dL — ABNORMAL HIGH (ref <1.0)

## 2019-02-15 MED ORDER — FUROSEMIDE 10 MG/ML IJ SOLN
20.0000 mg | Freq: Once | INTRAMUSCULAR | Status: AC
  Administered 2019-02-15: 12:00:00 20 mg via INTRAVENOUS
  Filled 2019-02-15: qty 2

## 2019-02-15 MED ORDER — HYDROCOD POLST-CPM POLST ER 10-8 MG/5ML PO SUER
5.0000 mL | Freq: Two times a day (BID) | ORAL | Status: DC
  Administered 2019-02-15 – 2019-02-16 (×3): 5 mL via ORAL
  Filled 2019-02-15 (×3): qty 5

## 2019-02-15 MED ORDER — FUROSEMIDE 8 MG/ML PO SOLN: 20.0000 mg | Freq: Once | ORAL | Status: DC

## 2019-02-15 MED ORDER — POTASSIUM CHLORIDE 20 MEQ/15ML (10%) PO SOLN
20.0000 meq | Freq: Once | ORAL | Status: AC
  Administered 2019-02-15: 12:00:00 20 meq via ORAL
  Filled 2019-02-15: qty 15

## 2019-02-15 NOTE — Plan of Care (Signed)
Discussed with patient plan of care for the evening, pain management and they informed contact person his contact Kathie Rhodes (didn't want me to update at this this time) with some teach back displayed.

## 2019-02-15 NOTE — Progress Notes (Signed)
PROGRESS NOTE                                                                                                                                                                                                             Patient Demographics:    Charles Harvey, is a 43 y.o. male, DOB - 08/19/1976, ZOX:[id number]  Outpatient Primary MD for the patient is System, Pcp Not In    LOS - 4  CC Fever     Brief Narrative   Charles Harvey is a 43 y.o. male with no past medical history or previous hospitalizations who was in his usual state of health when approximately 1 week ago, started experiencing a multitude of symptoms including diarrhea, fever, nonproductive cough and shortness of breath.  With persistence of symptoms, he came into the emergency room at [facility] on the night of 02/11/19.   Subjective:   Patient in bed, appears comfortable, denies any headache, no fever, no chest pain or pressure, much improved cough and shortness of breath , no abdominal pain. No focal weakness.   Assessment  & Plan :     1. Acute Hypoxic Resp. Failure due to Acute Covid 19 Viral Illness during the ongoing 2020 Covid 19 Pandemic - with [facility], chest x-ray showing viral pneumonitis, elevated inflammatory markers, he was started on IV steroids along with Actemra on 02/13/2019, inflammatory markers have started to trend down, shortness of breath has improved. Encouraged to sit up in chair in the daytime, flutter valve added for pulmonary toiletry, when in bed requested to prone he has been noncompliant with the suggestions and was counseled strictly in the presence of nursing staff on 02/14/2019.  Continue steroids, increase activity and titrate down oxygen, clinically better likely discharge in the next 1 to 2 days if clinical improvement continues.  COVID-19 Labs  Recent Labs    02/13/19 0333 02/14/19  0126 02/15/19 0432  DDIMER  --   --  0.66*  FERRITIN 716* 737* 754*  CRP 4.4* 1.9* 1.0*    No results found for: SARSCOV2NAA   2.  Mild transaminitis- due to COVID-19 infection + Actemra use. Stable trend, no right upper quadrant pain and acute hepatitis panel negative.  3.  Hypokalemia.  Replaced and stable.  4.  Mild fluid overload.  Gentle Lasix  x1 and monitor clinically.      Condition -   Fair  Family Communication  :  None  Code Status :  Full  Diet : Regular  Disposition Plan  :  Home 1-2 days  Consults  :  None  Procedures  :  None  PUD Prophylaxis : None  DVT Prophylaxis  :  Lovenox   Lab Results  Component Value Date   PLT 213 02/15/2019    Inpatient Medications  Scheduled Meds: . chlorpheniramine-HYDROcodone  5 mL Oral Q12H  . enoxaparin (LOVENOX) injection  40 mg Subcutaneous Q12H  . furosemide  20 mg Intravenous Once  . methylPREDNISolone (SOLU-MEDROL) injection  60 mg Intravenous Q12H  . potassium chloride  20 mEq Oral Once   Continuous Infusions:  PRN Meds:.acetaminophen, guaiFENesin-dextromethorphan, [DISCONTINUED] ondansetron **OR** ondansetron (ZOFRAN) IV, polyethylene glycol, polyvinyl alcohol  Antibiotics  :    Anti-infectives (From admission, onward)   Start     Dose/Rate Route Frequency Ordered Stop   02/12/19 1800  azithromycin (ZITHROMAX) 500 mg in sodium chloride 0.9 % 250 mL IVPB  Status:  Discontinued     500 mg 250 mL/hr over 60 Minutes Intravenous Every 24 hours 02/11/19 2150 02/12/19 0748   02/12/19 1700  cefTRIAXone (ROCEPHIN) 1 g in sodium chloride 0.9 % 100 mL IVPB  Status:  Discontinued     1 g 200 mL/hr over 30 Minutes Intravenous Every 24 hours 02/11/19 2150 02/12/19 0748       Time Spent in minutes  30   Susa Raring M.D on 02/15/2019 at 11:56 AM  To page go to [email or web address] - password [facility]  [facility] -  Office  [phone]     Admit date - 02/11/2019    4    Objective:   Vitals:    02/15/19 0500 02/15/19 0600 02/15/19 0742 02/15/19 1140  BP:   117/74 123/86  Pulse: (!) 53 (!) 55 67 63  Resp: (!) 24 (!) 22 (!) 30 (!) 38  Temp:   98.3 F (36.8 C) 98.5 F (36.9 C)  TempSrc:   Oral Oral  SpO2: 94% 93% 93% 93%  Weight:      Height:        Wt Readings from Last 3 Encounters:  02/11/19 74.8 kg     Intake/Output Summary (Last 24 hours) at 02/15/2019 1156 Last data filed at 02/15/2019 0600 Gross per 24 hour  Intake 600 ml  Output -  Net 600 ml     Physical Exam  Awake Alert, Oriented X 3, No new F.N deficits, Normal affect [place].AT,PERRAL Supple Neck,No JVD, No cervical lymphadenopathy appriciated.  Symmetrical Chest wall movement, Good air movement bilaterally, CTAB RRR,No Gallops, Rubs or new Murmurs, No Parasternal Heave +ve B.Sounds, Abd Soft, No tenderness, No organomegaly appriciated, No rebound - guarding or rigidity. No Cyanosis, Clubbing or edema, No new Rash or bruise    Data Review:    CBC Recent Labs  Lab 02/12/19 0620 02/13/19 0333 02/14/19 0126 02/15/19 0432  WBC 6.0 12.6* 13.2* 10.2  HGB 15.7 14.3 14.5 15.0  HCT 44.9 42.8 43.2 44.5  PLT 148* 178 200 213  MCV 97.8 98.6 100.2* 98.7  MCH 34.2* 32.9 33.6 33.3  MCHC 35.0 33.4 33.6 33.7  RDW 13.6 13.6 13.8 13.5  LYMPHSABS 1.2 1.1 1.0 0.9  MONOABS 0.3 0.7 0.7 0.6  EOSABS 0.0 0.0 0.0 0.0  BASOSABS 0.0 0.0 0.0 0.0    Chemistries  Recent Labs  Lab 02/12/19 0620 02/13/19  6010 02/14/19 0126 02/15/19 0432  NA 135 137 141 137  K 3.1* 3.5 3.8 3.8  CL 94* 101 104 103  CO2 29 27 23 27   GLUCOSE 156* 140* 119* 128*  BUN 15 17 16 16   CREATININE 0.73 0.65 0.59* 0.63  CALCIUM 7.9* 8.0* 8.3* 8.2*  MG 2.5* 2.4 2.5* 2.7*  AST 211* 119* 163* 205*  ALT 84* 69* 84* 132*  ALKPHOS 121 126 137* 143*  BILITOT 0.8 0.6 0.7 0.9   ------------------------------------------------------------------------------------------------------------------ Recent Labs    02/13/19 0333  TRIG 233*    No  results found for: HGBA1C ------------------------------------------------------------------------------------------------------------------ No results for input(s): TSH, T4TOTAL, T3FREE, THYROIDAB in the last 72 hours.  Invalid input(s): FREET3  Cardiac Enzymes No results for input(s): CKMB, TROPONINI, MYOGLOBIN in the last 168 hours.  Invalid input(s): CK ------------------------------------------------------------------------------------------------------------------ No results found for: BNP  Micro Results No results found for this or any previous visit (from the past 240 hour(s)).  Radiology Reports Dg Chest Port 1 View  Result Date: 02/14/2019 CLINICAL DATA:  Shortness of breath EXAM: PORTABLE CHEST 1 VIEW COMPARISON:  Feb 12, 2019 FINDINGS: Worsening bilateral pulmonary infiltrates.  No other changes. IMPRESSION: Worsening bilateral pulmonary infiltrates. The interval change is significant. Electronically Signed   By: Gerome Sam III M.D   On: 02/14/2019 12:06   Dg Chest Port 1 View  Result Date: 02/12/2019 CLINICAL DATA:  Shortness of breath. Positive test for COVID-19. EXAM: PORTABLE CHEST 1 VIEW COMPARISON:  02/11/2019 FINDINGS: The cardiac silhouette is upper limits of normal in size. The lungs are mildly hypoinflated with diffuse bilateral interstitial type opacities as well as asymmetric opacity in the left mid lung, overall similar to the prior study. No pleural effusion or pneumothorax is identified. No acute osseous abnormality is seen. IMPRESSION: Unchanged ill-defined lung opacities compatible with known viral infection. Electronically Signed   By: Sebastian Ache M.D.   On: 02/12/2019 08:55

## 2019-02-15 NOTE — Progress Notes (Signed)
CardioV[facility] Research Department and AHF Team  ReDS Research Project   Patient #: [id number]  ReDS Measurement  Right: low quality x 3  Left:   

## 2019-02-16 ENCOUNTER — Inpatient Hospital Stay (HOSPITAL_COMMUNITY): Payer: HRSA Program

## 2019-02-16 LAB — CBC WITH DIFFERENTIAL/PLATELET
Abs Immature Granulocytes: 0.38 10*3/uL — ABNORMAL HIGH (ref 0.00–0.07)
Basophils Absolute: 0 10*3/uL (ref 0.0–0.1)
Basophils Relative: 0 %
Eosinophils Absolute: 0 10*3/uL (ref 0.0–0.5)
Eosinophils Relative: 0 %
HCT: 46.3 % (ref 39.0–52.0)
Hemoglobin: 15.7 g/dL (ref 13.0–17.0)
Immature Granulocytes: 4 %
Lymphocytes Relative: 9 %
Lymphs Abs: 0.9 10*3/uL (ref 0.7–4.0)
MCH: 33.3 pg (ref 26.0–34.0)
MCHC: 33.9 g/dL (ref 30.0–36.0)
MCV: 98.3 fL (ref 80.0–100.0)
Monocytes Absolute: 0.4 10*3/uL (ref 0.1–1.0)
Monocytes Relative: 4 %
Neutro Abs: 8.9 10*3/uL — ABNORMAL HIGH (ref 1.7–7.7)
Neutrophils Relative %: 83 %
Platelets: 242 10*3/uL (ref 150–400)
RBC: 4.71 MIL/uL (ref 4.22–5.81)
RDW: 13.4 % (ref 11.5–15.5)
WBC: 10.7 10*3/uL — ABNORMAL HIGH (ref 4.0–10.5)
nRBC: 0 % (ref 0.0–0.2)

## 2019-02-16 LAB — COMPREHENSIVE METABOLIC PANEL
ALT: 161 U/L — ABNORMAL HIGH (ref 0–44)
AST: 168 U/L — ABNORMAL HIGH (ref 15–41)
Albumin: 3 g/dL — ABNORMAL LOW (ref 3.5–5.0)
Alkaline Phosphatase: 137 U/L — ABNORMAL HIGH (ref 38–126)
Anion gap: 10 (ref 5–15)
BUN: 19 mg/dL (ref 6–20)
CO2: 24 mmol/L (ref 22–32)
Calcium: 8.4 mg/dL — ABNORMAL LOW (ref 8.9–10.3)
Chloride: 103 mmol/L (ref 98–111)
Creatinine, Ser: 0.62 mg/dL (ref 0.61–1.24)
GFR calc Af Amer: >60 mL/min (ref >60)
GFR calc non Af Amer: >60 mL/min (ref >60)
Glucose, Bld: 135 mg/dL — ABNORMAL HIGH (ref 70–99)
Potassium: 3.8 mmol/L (ref 3.5–5.1)
Sodium: 137 mmol/L (ref 135–145)
Total Bilirubin: 0.7 mg/dL (ref 0.3–1.2)
Total Protein: 6.7 g/dL (ref 6.5–8.1)

## 2019-02-16 LAB — C-REACTIVE PROTEIN: CRP: <0.8 mg/dL (ref <1.0)

## 2019-02-16 LAB — D-DIMER, QUANTITATIVE: D-Dimer, Quant: 0.46 ug/mL-FEU (ref 0.00–0.50)

## 2019-02-16 LAB — FERRITIN: Ferritin: 627 ng/mL — ABNORMAL HIGH (ref 24–336)

## 2019-02-16 LAB — MAGNESIUM: Magnesium: 2.5 mg/dL — ABNORMAL HIGH (ref 1.7–2.4)

## 2019-02-16 MED ORDER — ALBUTEROL SULFATE HFA 108 (90 BASE) MCG/ACT IN AERS: 2.0000 | INHALATION_SPRAY | Freq: Four times a day (QID) | RESPIRATORY_TRACT | 2 refills | Status: AC | PRN

## 2019-02-16 MED ORDER — ASPIRIN EC 81 MG PO TBEC: 81.0000 mg | DELAYED_RELEASE_TABLET | Freq: Every day | ORAL | 0 refills | Status: AC

## 2019-02-16 MED ORDER — PREDNISONE 5 MG PO TABS: ORAL_TABLET | ORAL | 0 refills | Status: AC

## 2019-02-16 NOTE — Discharge Instructions (Signed)
Follow with Primary MD  in 7 days   Get CBC, CMP, 2 view Chest X ray -  checked  by Primary MD  in 5-7 days   Activity: As tolerated with Full fall precautions use walker/cane & assistance as needed  Disposition Home   Diet: Heart Healthy    Special Instructions: If you have smoked or chewed Tobacco  in the last 2 yrs please stop smoking, stop any regular Alcohol  and or any Recreational drug use.  On your next visit with your primary care physician please Get Medicines reviewed and adjusted.  Please request your Prim.MD to go over all Hospital Tests and Procedure/Radiological results at the follow up, please get all Hospital records sent to your Prim MD by signing hospital release before you go home.  If you experience worsening of your admission symptoms, develop shortness of breath, life threatening emergency, suicidal or homicidal thoughts you must seek medical attention immediately by calling 911 or calling your MD immediately  if symptoms less severe.  You Must read complete instructions/literature along with all the possible adverse reactions/side effects for all the Medicines you take and that have been prescribed to you. Take any new Medicines after you have completely understood and accpet all the possible adverse reactions/side effects.       Person Under Monitoring Name: Charles Harvey  Location[place] [address] [place] [id number]   Infection Prevention Recommendations for Individuals Confirmed to have, or Being Evaluated for, 2019 Novel Coronavirus (COVID-19) Infection Who Receive Care at Home  Individuals who are confirmed to have, or are being evaluated for, COVID-19 should follow the prevention steps below until a healthcare provider or local or state health department says they can return to normal activities.  Stay home except to get medical care You should restrict activities outside your home, except for getting medical care. Do not go to work, school,  or public areas, and do not use public transportation or taxis.  Call ahead before visiting your doctor Before your medical appointment, call the healthcare provider and tell them that you have, or are being evaluated for, COVID-19 infection. This will help the healthcare providers office take steps to keep other people from getting infected. Ask your healthcare provider to call the local or state health department.  Monitor your symptoms Seek prompt medical attention if your illness is worsening (e.g., difficulty breathing). Before going to your medical appointment, call the healthcare provider and tell them that you have, or are being evaluated for, COVID-19 infection. Ask your healthcare provider to call the local or state health department.  Wear a facemask You should wear a facemask that covers your nose and mouth when you are in the same room with other people and when you visit a healthcare provider. People who live with or visit you should also wear a facemask while they are in the same room with you.  Separate yourself from other people in your home As much as possible, you should stay in a different room from other people in your home. Also, you should use a separate bathroom, if available.  Avoid sharing household items You should not share dishes, drinking glasses, cups, eating utensils, towels, bedding, or other items with other people in your home. After using these items, you should wash them thoroughly with soap and water.  Cover your coughs and sneezes Cover your mouth and nose with a tissue when you cough or sneeze, or you can cough or sneeze into your sleeve.  Throw used tissues in a lined trash can, and immediately wash your hands with soap and water for at least 20 seconds or use an alcohol-based hand rub.  Wash your [employer] your hands often and thoroughly with soap and water for at least 20 seconds. You can use an alcohol-based hand sanitizer if soap and  water are not available and if your hands are not visibly dirty. Avoid touching your eyes, nose, and mouth with unwashed hands.   Prevention Steps for Caregivers and Household Members of Individuals Confirmed to have, or Being Evaluated for, COVID-19 Infection Being Cared for in the Home  If you live with, or provide care at home for, a person confirmed to have, or being evaluated for, COVID-19 infection please follow these guidelines to prevent infection:  Follow healthcare providers instructions Make sure that you understand and can help the patient follow any healthcare provider instructions for all care.  Provide for the patients basic needs You should help the patient with basic needs in the home and provide support for getting groceries, prescriptions, and other personal needs.  Monitor the patients symptoms If they are getting sicker, call his or her medical provider and tell them that the patient has, or is being evaluated for, COVID-19 infection. This will help the healthcare providers office take steps to keep other people from getting infected. Ask the healthcare provider to call the local or state health department.  Limit the number of people who have contact with the patient  If possible, have only one caregiver for the patient.  Other household members should stay in another home or place of residence. If this is not possible, they should stay  in another room, or be separated from the patient as much as possible. Use a separate bathroom, if available.  Restrict visitors who do not have an essential need to be in the home.  Keep older adults, very young children, and other sick people away from the patient Keep older adults, very young children, and those who have compromised immune systems or chronic health conditions away from the patient. This includes people with chronic heart, lung, or kidney conditions, diabetes, and cancer.  Ensure good ventilation Make  sure that shared spaces in the home have good air flow, such as from an air conditioner or an opened window, weather permitting.  Wash your hands often  Wash your hands often and thoroughly with soap and water for at least 20 seconds. You can use an alcohol based hand sanitizer if soap and water are not available and if your hands are not visibly dirty.  Avoid touching your eyes, nose, and mouth with unwashed hands.  Use disposable paper towels to dry your hands. If not available, use dedicated cloth towels and replace them when they become wet.  Wear a facemask and gloves  Wear a disposable facemask at all times in the room and gloves when you touch or have contact with the patients blood, body fluids, and/or secretions or excretions, such as sweat, saliva, sputum, nasal mucus, vomit, urine, or feces.  Ensure the mask fits over your nose and mouth tightly, and do not touch it during use.  Throw out disposable facemasks and gloves after using them. Do not reuse.  Wash your hands immediately after removing your facemask and gloves.  If your personal clothing becomes contaminated, carefully remove clothing and launder. Wash your hands after handling contaminated clothing.  Place all used disposable facemasks, gloves, and other waste in a lined container  before disposing them with other household waste.  Remove gloves and wash your hands immediately after handling these items.  Do not share dishes, glasses, or other household items with the patient  Avoid sharing household items. You should not share dishes, drinking glasses, cups, eating utensils, towels, bedding, or other items with a patient who is confirmed to have, or being evaluated for, COVID-19 infection.  After the person uses these items, you should wash them thoroughly with soap and water.  Wash laundry thoroughly  Immediately remove and wash clothes or bedding that have blood, body fluids, and/or secretions or excretions,  such as sweat, saliva, sputum, nasal mucus, vomit, urine, or feces, on them.  Wear gloves when handling laundry from the patient.  Read and follow directions on labels of laundry or clothing items and detergent. In general, wash and dry with the warmest temperatures recommended on the label.  Clean all areas the individual has used often  Clean all touchable surfaces, such as counters, tabletops, doorknobs, bathroom fixtures, toilets, phones, keyboards, tablets, and bedside tables, every day. Also, clean any surfaces that may have blood, body fluids, and/or secretions or excretions on them.  Wear gloves when cleaning surfaces the patient has come in contact with.  Use a diluted bleach solution (e.g., dilute bleach with 1 part bleach and 10 parts water) or a household disinfectant with a label that says EPA-registered for coronaviruses. To make a bleach solution at home, add 1 tablespoon of bleach to 1 quart (4 cups) of water. For a larger supply, add  cup of bleach to 1 gallon (16 cups) of water.  Read labels of cleaning products and follow recommendations provided on product labels. Labels contain instructions for safe and effective use of the cleaning product including precautions you should take when applying the product, such as wearing gloves or eye protection and making sure you have good ventilation during use of the product.  Remove gloves and wash hands immediately after cleaning.  Monitor yourself for signs and symptoms of illness Caregivers and household members are considered close contacts, should monitor their health, and will be asked to limit movement outside of the home to the extent possible. Follow the monitoring steps for close contacts listed on the symptom monitoring form.   ? If you have additional questions, contact your local health department or call the epidemiologist on call at [phone] (available 24/7). ? This guidance is subject to change. For the most  up-to-date guidance from [facility], please refer to their website: [email or web address]

## 2019-02-16 NOTE — Progress Notes (Signed)
SATURATION QUALIFICATIONS: (This note is used to comply with regulatory documentation for home oxygen)  Patient Saturations on Room Air at Rest = 90%  Patient Saturations on Room Air while Ambulating = 88-92%  Patient Saturations on 0 Liters of oxygen while Ambulating = 0%  Please briefly explain why patient needs home oxygen:  Pt with slight shortness of breath while walking.  No acute distress.

## 2019-02-16 NOTE — Discharge Summary (Signed)
[name] [id number] DOB: 01/26/1976 DOA: 02/11/2019  PCP: System, Pcp Not In  Admit date: 02/11/2019  Discharge date: 02/16/2019  Admitted From: Home  Disposition:  Home   Recommendations for Outpatient Follow-up:   Follow up with PCP in 1-2 weeks  PCP Please obtain BMP/CBC, 2 view CXR in 1week,  (see Discharge instructions)   PCP Please follow up on the following pending results:    Home Health: None   Equipment/Devices: 02 2lits/min  Consultations: None Discharge Condition: Stable   CODE STATUS: Full   Diet Recommendation: Heart Healthy   CC - SOB   Brief history of present illness from the day of admission and additional interim summary    Charles Harvey a 42 y.o.malewithno past medical history or previous hospitalizations who was in his usual state of health when approximately 1 week ago, started experiencing a multitude of symptoms including diarrhea, fever, nonproductive cough and shortness of breath. With persistence of symptoms, he [facility] on the night of 02/11/19.                                                                 Hospital Course    Acute Hypoxic Resp. Failure due to Acute Covid 19 Viral Illness during the ongoing 2020 Covid 19 Pandemic - with [facility] hypoxia [facility], chest x-ray showing viral pneumonitis, elevated inflammatory markers, he was started on IV steroids along with Actemra on 02/13/2019, inflammatory markers have started to trend down, shortness of breath is much improved and he is stable at rest on 1 to 2 L nasal cannula oxygen and feels a whole lot better, will be discharged home on oral steroid taper, written COVID 19 home isolation and home care instructions provided.  He will get home follow-up  along with PCP follow-up in a week.  COVID-19 Labs  Recent Labs    02/14/19 0126 02/15/19 0432 02/16/19 0443  DDIMER  --  0.66* 0.46  FERRITIN 737* 754* 627*  CRP 1.9* 1.0* <0.8    No results found for: SARSCOV2NAA  2.  Mild transaminitis- due to COVID-19 infection + Actemra use. Stable trend, no right upper quadrant pain and acute hepatitis panel negative.  Repeat CMP by PCP in 1 week.  3.  Hypokalemia.  Replaced and stable.  4.  Mild fluid overload.  This was due to overzealous IV fluid replacement, resolved after 1 dose of Lasix.      Discharge diagnosis     Principal Problem:   Acute respiratory failure with hypoxia (HCC) Active Problems:   Acute respiratory disease due to COVID-19 virus   Transaminitis    Discharge instructions    Discharge Instructions    Diet - low sodium heart healthy   Complete by:  As  directed    Discharge instructions   Complete by:  As directed    Follow with Primary MD  in 7 days   Get CBC, CMP, 2 view Chest X ray -  checked  by Primary MD  in 5-7 days   Activity: As tolerated with Full fall precautions use walker/cane & assistance as needed  Disposition Home   Diet: Heart Healthy    Special Instructions: If you have smoked or chewed Tobacco  in the last 2 yrs please stop smoking, stop any regular Alcohol  and or any Recreational drug use.  On your next visit with your primary care physician please Get Medicines reviewed and adjusted.  Please request your Prim.MD to go over all Hospital Tests and Procedure/Radiological results at the follow up, please get all Hospital records sent to your Prim MD by signing hospital release before you go home.  If you experience worsening of your admission symptoms, develop shortness of breath, life threatening emergency, suicidal or homicidal thoughts you must seek medical attention immediately by calling 911 or calling your MD immediately  if symptoms less severe.  You Must read complete  instructions/literature along with all the possible adverse reactions/side effects for all the Medicines you take and that have been prescribed to you. Take any new Medicines after you have completely understood and accpet all the possible adverse reactions/side effects.   Increase activity slowly   Complete by:  As directed       Discharge Medications   Allergies as of 02/16/2019   No Known Allergies     Medication List    TAKE these medications   albuterol 108 (90 Base) MCG/ACT inhaler Commonly known as:  VENTOLIN HFA Inhale 2 puffs into the lungs every 6 (six) hours as needed for wheezing or shortness of breath.   aspirin EC 81 MG tablet Take 1 tablet (81 mg total) by mouth daily.   predniSONE 5 MG tablet Commonly known as:  DELTASONE Label  & dispense according to the schedule below. 10 Pills PO for 3 days then, 8 Pills PO for 3 days, 6 Pills PO for 3 days, 4 Pills PO for 3 days, 2 Pills PO for 3 days, 1 Pills PO for 3 days, 1/2 Pill  PO for 3 days then STOP. Total 95 pills.            Durable Medical Equipment  (From admission, onward)         Start     Ordered   02/16/19 1123  For home use only DME oxygen  Once    Question Answer Comment  Mode or (Route) Nasal cannula   Liters per Minute 2   Frequency Continuous (stationary and portable oxygen unit needed)   Oxygen conserving device Yes   Oxygen delivery system Gas      02/16/19 1122          Follow-up Information    [facility] Follow up.   Why:  at 1:30pm for your televisit hospital follow-up appointment; Please be available for the call.  Contact information: [address] [place] [address] [phone]          Major procedures and Radiology Reports - PLEASE review detailed and final reports thoroughly  -       Dg Chest Port 1 View  Result Date: 02/16/2019 CLINICAL DATA:  Shortness of breath EXAM: PORTABLE CHEST 1 VIEW COMPARISON:   02/14/2019 FINDINGS: Cardiac shadow is stable. The  lungs are well aerated bilaterally with bilateral patchy infiltrates although overall improved when compared with the prior exam. No sizable effusion is noted. No bony abnormality is seen. IMPRESSION: Improved aeration when compared with the prior exam. Electronically Signed   By: Alcide Clever M.D.   On: 02/16/2019 08:03   Dg Chest Port 1 View  Result Date: 02/14/2019 CLINICAL DATA:  Shortness of breath EXAM: PORTABLE CHEST 1 VIEW COMPARISON:  Feb 12, 2019 FINDINGS: Worsening bilateral pulmonary infiltrates.  No other changes. IMPRESSION: Worsening bilateral pulmonary infiltrates. The interval change is significant. Electronically Signed   By: Gerome Sam III M.D   On: 02/14/2019 12:06   Dg Chest Port 1 View  Result Date: 02/12/2019 CLINICAL DATA:  Shortness of breath. Positive test for COVID-19. EXAM: PORTABLE CHEST 1 VIEW COMPARISON:  02/11/2019 FINDINGS: The cardiac silhouette is upper limits of normal in size. The lungs are mildly hypoinflated with diffuse bilateral interstitial type opacities as well as asymmetric opacity in the left mid lung, overall similar to the prior study. No pleural effusion or pneumothorax is identified. No acute osseous abnormality is seen. IMPRESSION: Unchanged ill-defined lung opacities compatible with known viral infection. Electronically Signed   By: Sebastian Ache M.D.   On: 02/12/2019 08:55    Micro Results     No results found for this or any previous visit (from the past 240 hour(s)).  Today   Subjective    Charles Harvey today has no headache,no chest abdominal pain,no new weakness tingling or numbness, feels much better wants to go home today.     Objective   Blood pressure 135/90, pulse 62, temperature 98.3 F (36.8 C), temperature source Oral, resp. rate (!) 21, height 6' (1.829 m), weight 74.8 kg, SpO2 90 %.   Intake/Output Summary (Last 24 hours) at 02/16/2019 1125 Last data filed at  02/16/2019 0400 Gross per 24 hour  Intake 1080 ml  Output 750 ml  Net 330 ml    Exam Awake Alert, Oriented x 3, No new F.N deficits, Normal affect [place].AT,PERRAL Supple Neck,No JVD, No cervical lymphadenopathy appriciated.  Symmetrical Chest wall movement, Good air movement bilaterally, CTAB RRR,No Gallops,Rubs or new Murmurs, No Parasternal Heave +ve B.Sounds, Abd Soft, Non tender, No organomegaly appriciated, No rebound -guarding or rigidity. No Cyanosis, Clubbing or edema, No new Rash or bruise   Data Review   CBC w Diff:  Lab Results  Component Value Date   WBC 10.7 (H) 02/16/2019   HGB 15.7 02/16/2019   HCT 46.3 02/16/2019   PLT 242 02/16/2019   LYMPHOPCT 9 02/16/2019   MONOPCT 4 02/16/2019   EOSPCT 0 02/16/2019   BASOPCT 0 02/16/2019    CMP:  Lab Results  Component Value Date   NA 137 02/16/2019   K 3.8 02/16/2019   CL 103 02/16/2019   CO2 24 02/16/2019   BUN 19 02/16/2019   CREATININE 0.62 02/16/2019   PROT 6.7 02/16/2019   ALBUMIN 3.0 (L) 02/16/2019   BILITOT 0.7 02/16/2019   ALKPHOS 137 (H) 02/16/2019   AST 168 (H) 02/16/2019   ALT 161 (H) 02/16/2019  .   Total Time in preparing paper work, data evaluation and todays exam - 35 minutes  Susa Raring M.D on 02/16/2019 at 11:25 AM  [facility]   Office  [phone]

## 2019-02-16 NOTE — Progress Notes (Signed)
CardioVascular Research Department and AHF Team  ReDS Research Project   Patient #: [id number]  ReDS Measurement  Right: low quality x 3  Left:

## 2019-02-16 NOTE — TOC Transition Note (Addendum)
Transition of Care [facility]) - CM/SW Discharge Note   Patient Details  Name: Charles Harvey MRN: [id number] Date of Birth: [birth year]  [facility] CM/SW Contact:  Colleen Can RN, BSN, NCM-BC, ACM-RN [phone] (working remotely) Phone Number: 02/16/2019, 12:49 PM   Clinical Narrative:    CM consult acknowledged for DME. CM spoke to the patient via phone utilizing [name] (ID# [phone]) to discuss the POC and MD order for home oxygen. Patient verified as having no active health insurance, nor an established PCP. CM arranged a hospital f/u appointment at: [facility] on 02/18/19 @ 1350; AVS updated, with the patient made aware and verbalized understanding. CM discussed Dr. Doristine Church order for home oxygen, with the patient stating, "I haven't been on oxygen for 2 days, so I don't need it." CM informed Dr. Thedore Mins via Epic chat, and requested Sharlett Iles, RN (primary nurse) to evaluated the patient's sat's for oxygen needs. Patient is ambulating on RA @ 88-92% with no acute distress. CM spoke to Durward Fortes, [place] liaison, with the patient to be contacted via phone to evaluate the patient's respiratory status and coordinate home oxygen if needed. CM also documented instructions on the AVS for the patient to contact [employer] if he began experiencing SOB. No further needs from CM.   Final next level of care: Home/Self Care Barriers to Discharge: No Barriers Identified   Patient Goals and CMS Choice Patient states their goals for this hospitalization and ongoing recovery are:: "to be able to go home, Am I leaving today?" CMS Medicare.gov Compare Post Acute Care list provided to:: Patient([employer] oxygen) Choice offered to / list presented to : Patient   Discharge Plan and Services   Discharge Planning Services: CM Consult, Follow-[facility] appt scheduled, [facility]            DME Arranged: Oxygen DME Agency: [facility] Date DME Agency Contacted: 02/16/19 Time  DME Agency Contacted: 1248 Representative spoke with at DME Agency: Durward Fortes liaison HH Arranged: NA HH Agency: NA        Social Determinants of Health (SDOH) Interventions     Readmission Risk Interventions No flowsheet data found.

## 2019-02-16 NOTE — Progress Notes (Signed)
Pt understands dc orders.

## 2019-02-18 ENCOUNTER — Inpatient Hospital Stay: Payer: Self-pay | Admitting: Primary Care

## 2019-02-26 ENCOUNTER — Telehealth (INDEPENDENT_AMBULATORY_CARE_PROVIDER_SITE_OTHER): Payer: Self-pay

## 2019-02-26 NOTE — Telephone Encounter (Signed)
Called and no answer relative to COVID home monitoring. Pt has no cell # listed and no email.

## 2020-03-08 IMAGING — DX PORTABLE CHEST - 1 VIEW
1 series · 1 of 1 positions shown · non-contrast
Comparison: 02/11/2019

CLINICAL DATA: Shortness of breath. Positive test for [id number].

EXAM:
PORTABLE CHEST 1 VIEW

[chest]
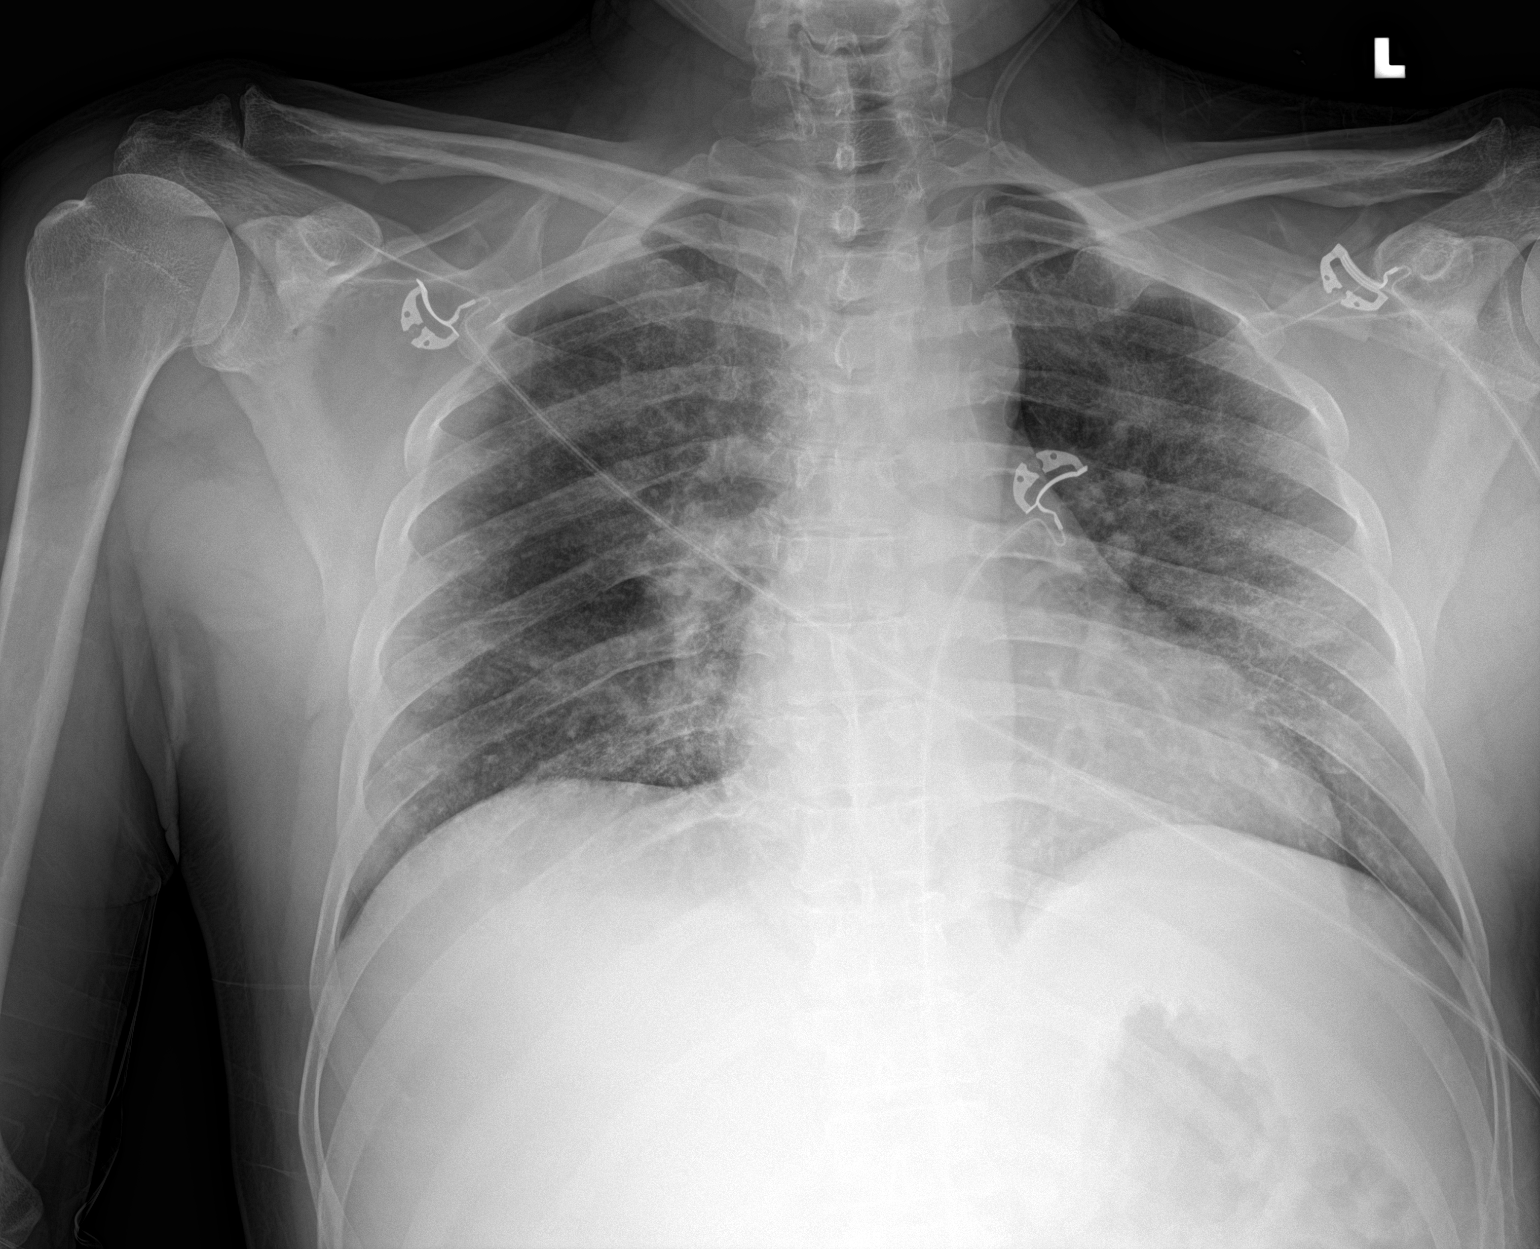

[1 of 1 positions shown; findings below may reference images not displayed]

FINDINGS: The cardiac silhouette is upper limits of normal in size. The lungs
are mildly hypoinflated with diffuse bilateral interstitial type
opacities as well as asymmetric opacity in the left mid lung,
overall similar to the prior study. No pleural effusion or
pneumothorax is identified. No acute osseous abnormality is seen.
IMPRESSION: Unchanged ill-defined lung opacities compatible with known viral
infection.
